# Patient Record
Sex: Female | Born: 2005 | Hispanic: Yes | Marital: Single | Smoking: Never smoker
Health system: Southern US, Community
[De-identification: ages and names within clinical notes are randomized; demographics above are authoritative.]

---
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
## 2023-03-21 ENCOUNTER — Other Ambulatory Visit: Payer: Self-pay

## 2023-03-21 ENCOUNTER — Ambulatory Visit: Payer: Commercial Managed Care - PPO | Admitting: Family Medicine

## 2023-03-21 VITALS — BP 119/51 | HR 67 | Wt 233.8 lb

## 2023-03-21 DIAGNOSIS — Z3491 Encounter for supervision of normal pregnancy, unspecified, first trimester: Secondary | ICD-10-CM | POA: Diagnosis not present

## 2023-03-21 DIAGNOSIS — Z3201 Encounter for pregnancy test, result positive: Secondary | ICD-10-CM | POA: Diagnosis not present

## 2023-03-21 LAB — POCT PREGNANCY, URINE: Preg Test, Ur: POSITIVE — AB

## 2023-03-21 NOTE — Progress Notes (Signed)
Possible Pregnancy  Here today for pregnancy confirmation. UPT in office today is positive. This is a planned pregnancy. Reviewed dating with patient:   LMP: 02/20/23 (within days)  EDD: 11/27/23 4w 1d today  Pt reports positive UPT at home 2 weeks ago, which may indicate unsure dating. Will schedule first trimester [place] to confirm dating; reviewed with Cresenzo, MD who recommends this at 8 weeks. OB history reviewed; single, full-term vaginal birth in 2022. Reviewed medications and allergies with patient; list of medications safe to take during pregnancy given. Cresenzo, MD to room for brief visit to establish care. Pt to schedule routine OB care during check out.  Marjo Bicker, RN 03/21/2023  8:29 AM  Chart reviewed for nurse visit. Agree with plan of care.  Introduced patient to the practice, discussed previous pregnancies.  Discussed initial prenatal intake visit with lab work and patient understanding.  No further questions or concerns.  Celedonio Savage, MD 03/21/2023 10:59 AM

## 2023-03-21 NOTE — Patient Instructions (Signed)

## 2023-03-27 ENCOUNTER — Emergency Department
Admission: EM | Admit: 2023-03-27 | Discharge: 2023-03-27 | Disposition: A | Discharge status: Home / Self Care | Payer: Commercial Managed Care - PPO | Attending: Emergency Medicine | Admitting: Emergency Medicine

## 2023-03-27 ENCOUNTER — Emergency Department: Payer: Commercial Managed Care - PPO

## 2023-03-27 ENCOUNTER — Other Ambulatory Visit: Payer: Self-pay

## 2023-03-27 DIAGNOSIS — Z3A01 Less than 8 weeks gestation of pregnancy: Secondary | ICD-10-CM | POA: Diagnosis not present

## 2023-03-27 DIAGNOSIS — O200 Threatened abortion: Secondary | ICD-10-CM | POA: Insufficient documentation

## 2023-03-27 DIAGNOSIS — O26851 Spotting complicating pregnancy, first trimester: Secondary | ICD-10-CM | POA: Diagnosis present

## 2023-03-27 LAB — CBC
HCT: 37.9 % (ref 36.0–46.0)
Hemoglobin: 12.4 g/dL (ref 12.0–15.0)
MCH: 27.1 pg (ref 26.0–34.0)
MCHC: 32.7 g/dL (ref 30.0–36.0)
MCV: 82.9 fL (ref 80.0–100.0)
Platelets: 408 10*3/uL — ABNORMAL HIGH (ref 150–400)
RBC: 4.57 MIL/uL (ref 3.87–5.11)
RDW: 13 % (ref 11.5–15.5)
WBC: 8.8 10*3/uL (ref 4.0–10.5)
nRBC: 0 % (ref 0.0–0.2)

## 2023-03-27 LAB — COMPREHENSIVE METABOLIC PANEL
ALT: 12 U/L (ref 0–44)
AST: 18 U/L (ref 15–41)
Albumin: 3.8 g/dL (ref 3.5–5.0)
Alkaline Phosphatase: 61 U/L (ref 38–126)
Anion gap: 11 (ref 5–15)
BUN: 13 mg/dL (ref 6–20)
CO2: 21 mmol/L — ABNORMAL LOW (ref 22–32)
Calcium: 8.7 mg/dL — ABNORMAL LOW (ref 8.9–10.3)
Chloride: 102 mmol/L (ref 98–111)
Creatinine, Ser: 0.55 mg/dL (ref 0.44–1.00)
GFR, Estimated: >60 mL/min (ref >60)
Glucose, Bld: 86 mg/dL (ref 70–99)
Potassium: 3.9 mmol/L (ref 3.5–5.1)
Sodium: 134 mmol/L — ABNORMAL LOW (ref 135–145)
Total Bilirubin: 0.4 mg/dL (ref <1.2)
Total Protein: 7.6 g/dL (ref 6.5–8.1)

## 2023-03-27 LAB — HCG, QUANTITATIVE, PREGNANCY: hCG, Beta Chain, Quant, S: 18 m[IU]/mL — ABNORMAL HIGH (ref <5)

## 2023-03-27 LAB — ABO/RH: ABO/RH(D): B POS

## 2023-03-27 LAB — POC URINE PREG, ED: Preg Test, Ur: NEGATIVE

## 2023-03-27 NOTE — ED Notes (Signed)
Patient discharged from ED by provider. Discharge instructions reviewed with patient and understanding verbalized. Patient ambulatory from ED in NAD.

## 2023-03-27 NOTE — Discharge Instructions (Signed)
As we discussed please take a pregnancy test in 1 week.  If this is negative then this is consistent with unfortunately likely miscarriage.  If positive in 1 week please follow-up with your OB/GYN or return to the emergency department for further workup.  Also return to the emergency department for any significant abdominal pain or any other symptom concerning to yourself.

## 2023-03-27 NOTE — ED Provider Triage Note (Signed)
Emergency Medicine Provider Triage Evaluation Note  Maria Reese , a 17 y.o. female  was evaluated in triage.  Pt complains of vaginal bleeding that started this morning.  Patient reports she is approximately [redacted] weeks pregnant.    Review of Systems  Positive: Abdominal cramping Negative: Fever, recent illness.  Physical Exam  BP 129/89   Pulse 68   Temp 98.2 F (36.8 C) (Oral)   Resp 16   Ht 5\' 7"  (1.702 m)   Wt 106.1 kg   LMP 02/20/2023 (Within Days)   SpO2 99%   BMI 36.64 kg/m  Gen:   Awake, no distress   Resp:  Normal effort  MSK:   Moves extremities without difficulty  Other:  Abdomen soft and mildly lower abdominal tenderness    Medical Decision Making  Medically screening exam initiated at 3:27 PM.  Appropriate orders placed.  Inger Dandrea was informed that the remainder of the evaluation will be completed by another provider, this initial triage assessment does not replace that evaluation, and the importance of remaining in the ED until their evaluation is complete.  Vaginal bleeding workup in early pregnancy ordered in triage.   Romeo Apple, Charels Stambaugh A, PA-C 03/27/23 [phone]

## 2023-03-27 NOTE — ED Provider Notes (Signed)
[facility] Provider Note    Event Date/Time   First MD Initiated Contact with Patient 03/27/23 1717     (approximate)  History   Chief Complaint: Vaginal Bleeding  HPI  Maria Reese is a 17 y.o. female G2, P1 approximately 5 to [redacted] weeks pregnant who presents to the emergency department for vaginal bleeding.  According to the patient she took a positive pregnancy test at home 03/11/2023.  Believes her last menstrual period was the first week of November.  Patient states since this morning she has been experiencing vaginal bleeding similar to a menstrual cycle as well as some lower abdominal cramping.  Physical Exam   Triage Vital Signs: ED Triage Vitals  Encounter Vitals Group     BP 03/27/23 1520 129/89     Systolic BP Percentile --      Diastolic BP Percentile --      Pulse Rate 03/27/23 1519 68     Resp 03/27/23 1519 16     Temp 03/27/23 1519 98.2 F (36.8 C)     Temp Source 03/27/23 1519 Oral     SpO2 03/27/23 1519 99 %     Weight 03/27/23 1519 233 lb 14.5 oz (106.1 kg)     Height 03/27/23 1519 5\' 7"  (1.702 m)     Head Circumference --      Peak Flow --      Pain Score 03/27/23 1519 3     Pain Loc --      Pain Education --      Exclude from Growth Chart --     Most recent vital signs: Vitals:   03/27/23 1519 03/27/23 1520  BP:  129/89  Pulse: 68   Resp: 16   Temp: 98.2 F (36.8 C)   SpO2: 99%     General: Awake, no distress.  CV:  Good peripheral perfusion.  Regular rate and rhythm  Resp:  Normal effort.  Equal breath sounds bilaterally.  Abd:  No distention.  Soft, nontender.  No rebound or guarding.  Benign abdomen.    ED Results / Procedures / Treatments    RADIOLOGY  Ultrasound shows no IUP identified.   MEDICATIONS ORDERED IN ED: Medications - No data to display   IMPRESSION / MDM / ASSESSMENT AND PLAN / ED COURSE  I reviewed the triage vital signs and the nursing notes.  Patient's presentation is most  consistent with acute presentation with potential threat to life or bodily function.  Patient presents the emergency department for vaginal bleeding similar to a menstrual cycle approximately 5 to [redacted] weeks pregnant.  Patient states her first positive pregnancy test was 03/11/2023, unfortunately the patient's beta-hCG today is only 18 concerning for likely miscarriage.  Patient CBC is reassuring chemistry reassuring.  B+ blood type does not require RhoGAM.  Will obtain an ultrasound and have the patient follow-up with her doctor as long as there is no concerning findings.  Patient agreeable to plan of care.  No IUP on ultrasound.  Discussed with the patient the likelihood of a miscarriage.  Patient will check a home pregnancy test in 1 week if negative she will follow-up with her PCP, if positive patient will follow-up with OB or return to the emergency department.  Also discussed return precautions for any abdominal pain or significant bleeding.  FINAL CLINICAL IMPRESSION(S) / ED DIAGNOSES   Miscarriage in progress   Note:  This document was prepared using Dragon voice recognition software and may include  unintentional dictation errors.   Minna Antis, MD 03/27/23 Rickey Primus

## 2023-03-27 NOTE — ED Triage Notes (Signed)
Pt here with vaginal bleeding that started today. Pt states she saw a small amount of blood with no clots. Pt is [redacted] weeks pregnant. Pt denies NVD.

## 2023-04-17 ENCOUNTER — Other Ambulatory Visit: Payer: Commercial Managed Care - PPO

## 2023-05-02 ENCOUNTER — Telehealth: Payer: Commercial Managed Care - PPO

## 2023-05-09 ENCOUNTER — Encounter: Payer: Self-pay | Admitting: Obstetrics and Gynecology

## 2023-06-21 IMAGING — US US OB FOLLOW-UP
1 series · 13 of 28 positions shown · non-contrast
Comparison: none

CLINICAL DATA: Evaluate fetal growth.

EXAM:
OBSTETRIC 14+ WK ULTRASOUND FOLLOW-UP

[Series 1: us ob follow-up · 0.23mm/px · 13 of 32 slices shown]
[im 2/32]
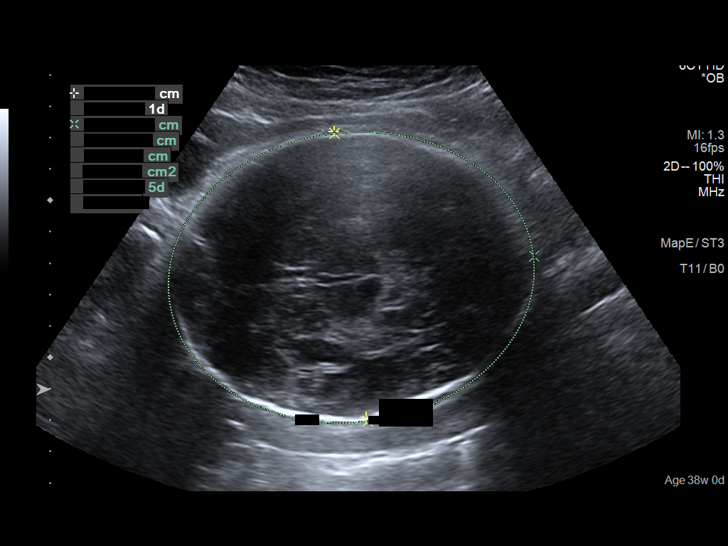
[im 4/32]
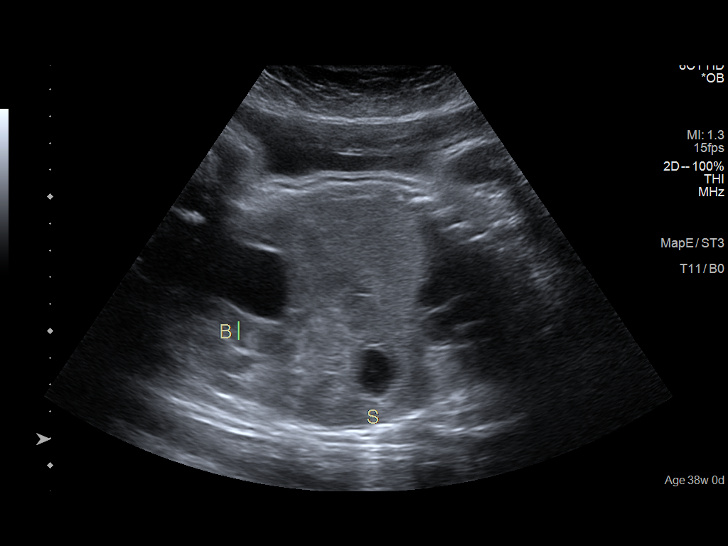
[im 6/32]
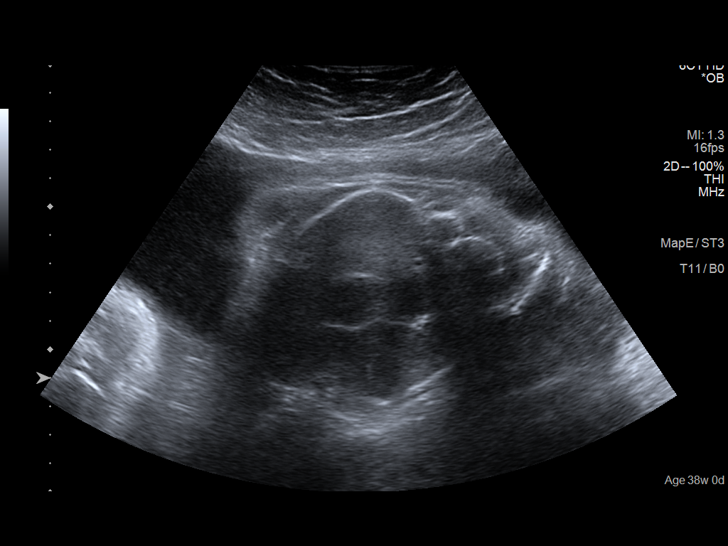
[im 9/32]
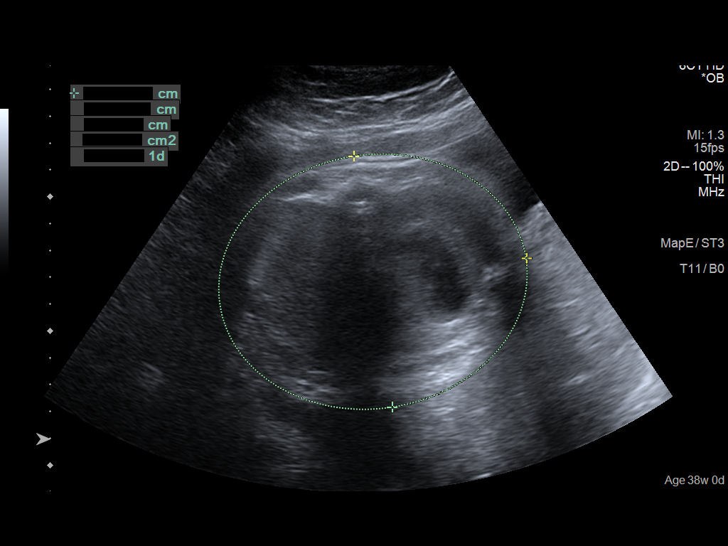
[im 11/32]
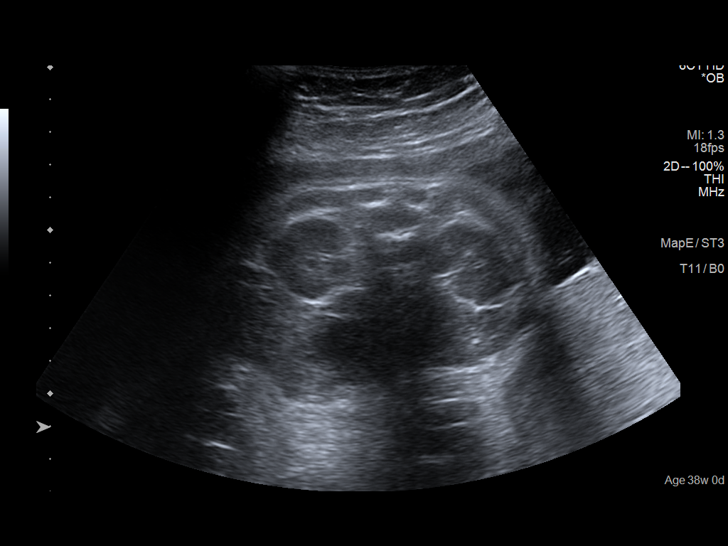
[im 13/32]
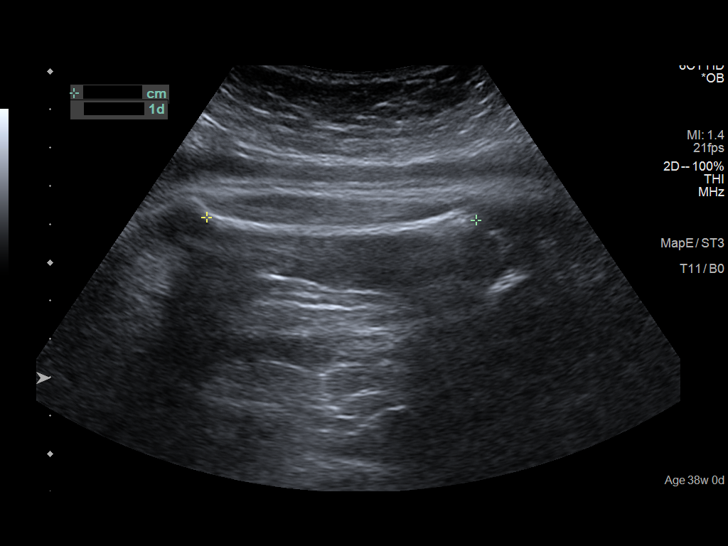
[im 17/32]
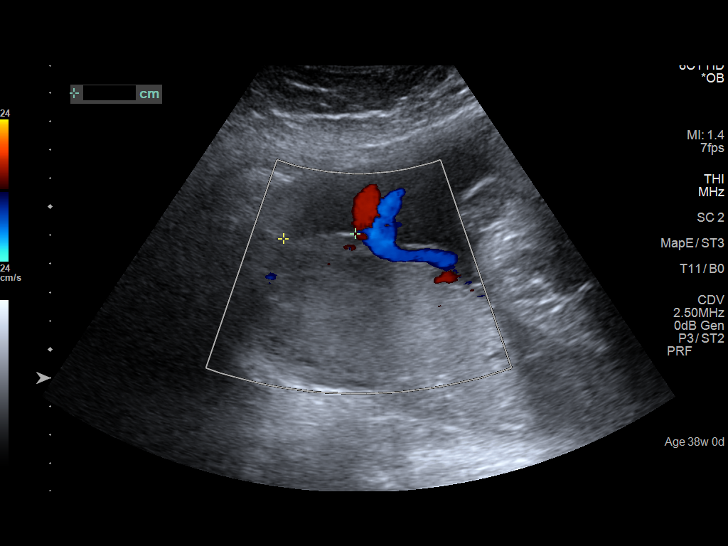
[im 19/32]
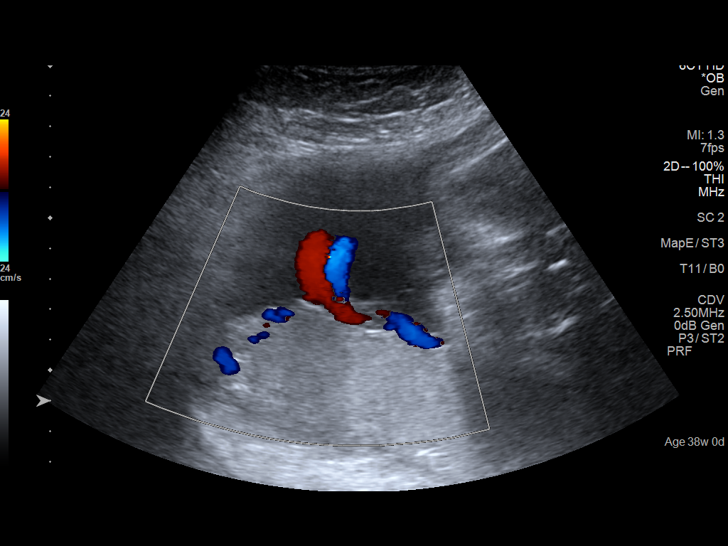
[im 21/32]
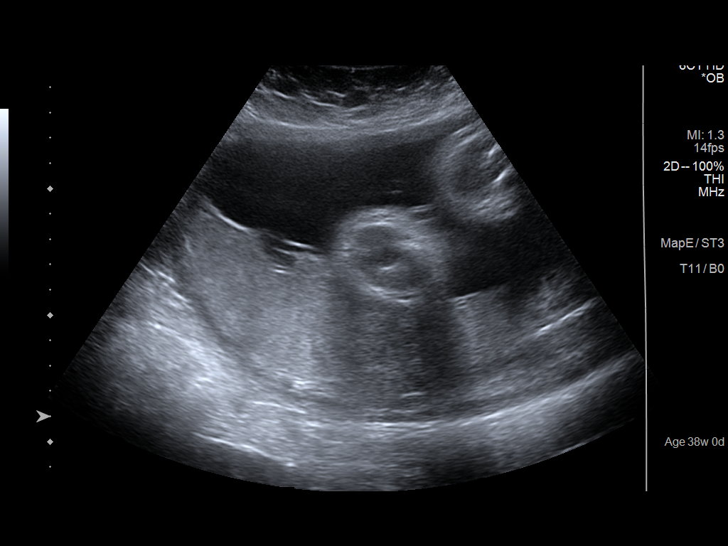
[im 23/32]
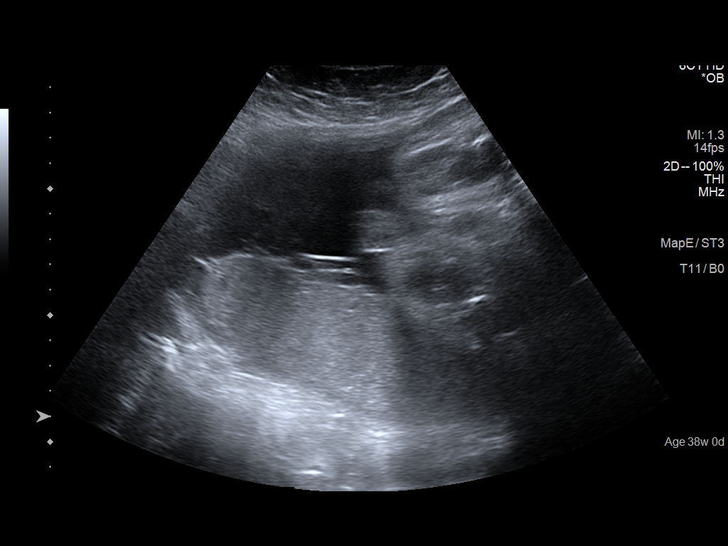
[im 26/32]
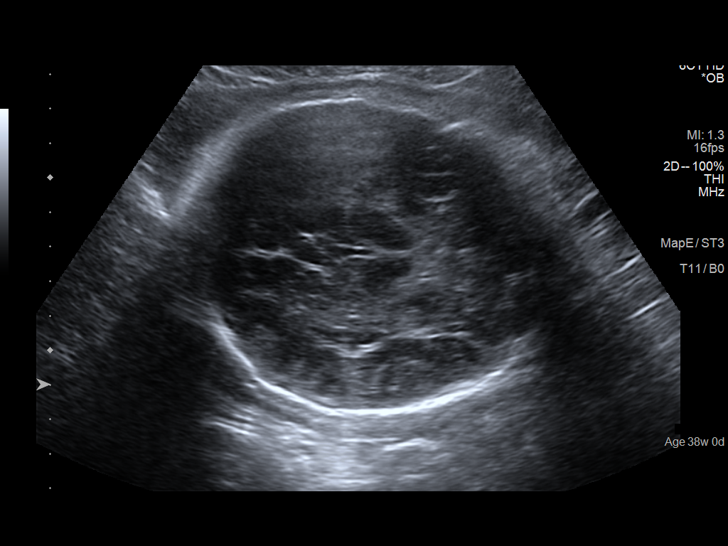
[im 28/32]
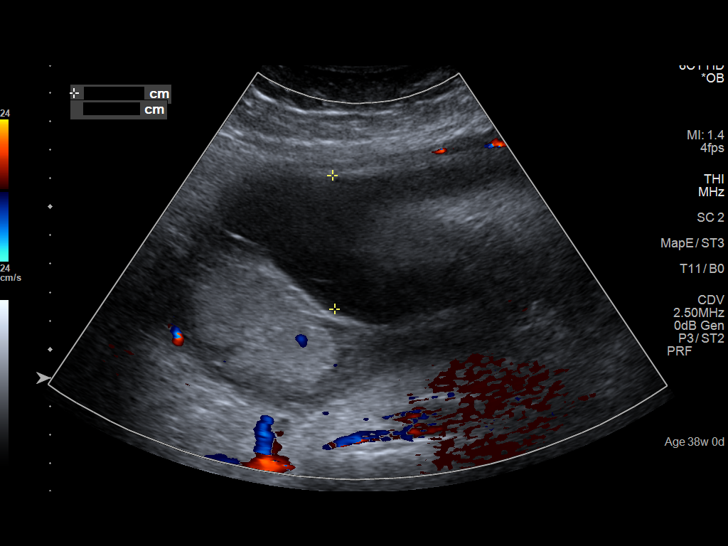
[im 30/32]
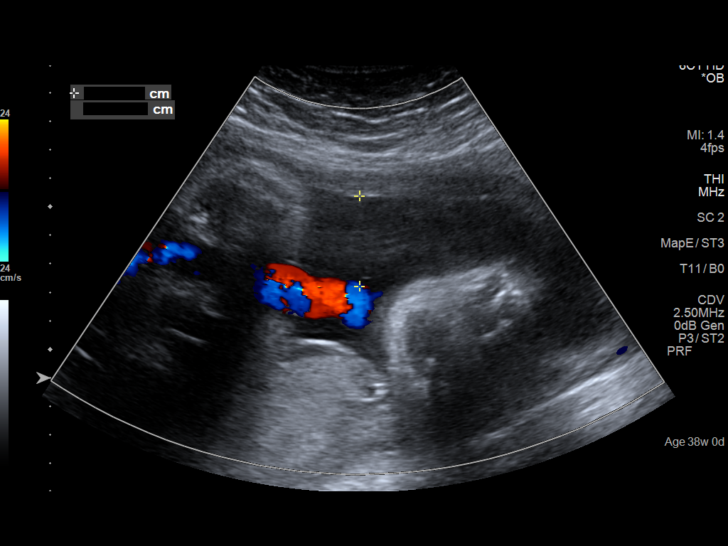

[13 of 28 positions shown; findings below may reference images not displayed]

FINDINGS: Number of Fetuses: 1

Heart Rate:  147 bpm

Movement: Yes

Presentation: Cephalic

Previa: No

Placental Location: Posterior

Amniotic Fluid (Subjective): Normal

Amniotic Fluid (Objective):

Vertical pocket 4.7cm

AFI 12.6 cm (5%ile= 7.3 cm, 95%= 23.9 cm for 38 wks)

FETAL BIOMETRY

BPD: 9.2cm 37w 1d

HC: 33.2cm  37w   6d

AC: 33.2cm  37w   1d

FL: 7.1cm  36w   1d

Current Mean GA: 37w 1d              US EDC: 11/05/2020

Assigned GA: 36w 5d     Assigned EDC: 11/08/2020

Estimated Fetal Weight:  3078g 35%ile

FETAL ANATOMY

Lateral Ventricles: Appears normal

Thalami/CSP: Appears normal

Posterior Fossa: Previously seen

Nuchal Region: Previously seen

Upper Lip: Previously seen

Spine: Previously seen

4 Chamber Heart on Left: Appears normal

LVOT: Not visualized

RVOT: Not visualized

Stomach on Left: Appears normal

3 Vessel Cord: Previously seen

Cord Insertion site: Previously seen

Kidneys: Appears normal

Bladder: Appears normal

Extremities: Previously seen

Sex: Male

Technical Limitations: Advanced age

Maternal Findings:

None
IMPRESSION: 1. Single live intrauterine pregnancy as detailed above.
2. AFI 12.6 cm
3. Estimated fetal weight 9178 g at 35 percentile.

## 2023-07-18 IMAGING — US US OB LIMITED
1 series · 14 of 14 positions shown · non-contrast
Comparison: 10/16/2020

CLINICAL DATA: Post dates. Assess amniotic fluid and biophysical
profile

EXAM:
LIMITED OBSTETRIC ULTRASOUND AND BIOPHYSICAL PROFILE

[Series 1: us fetal bpp wo non stress · 14 acquisitions, 14 frames shown]
[im 1/14]
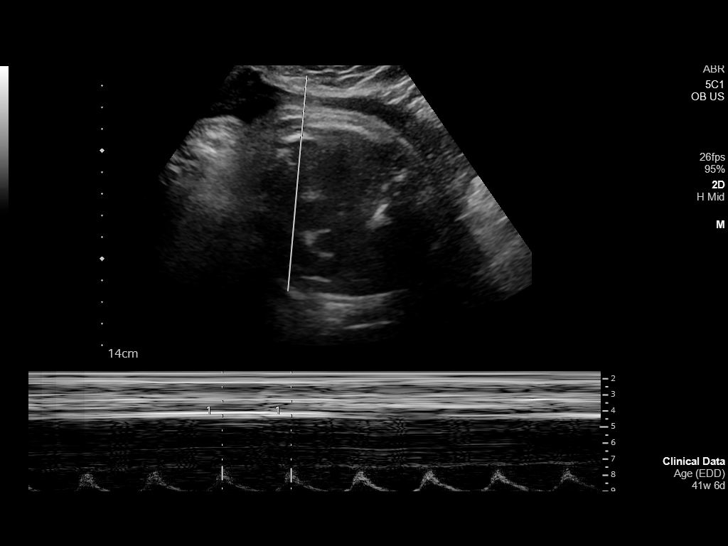
[im 2/14]
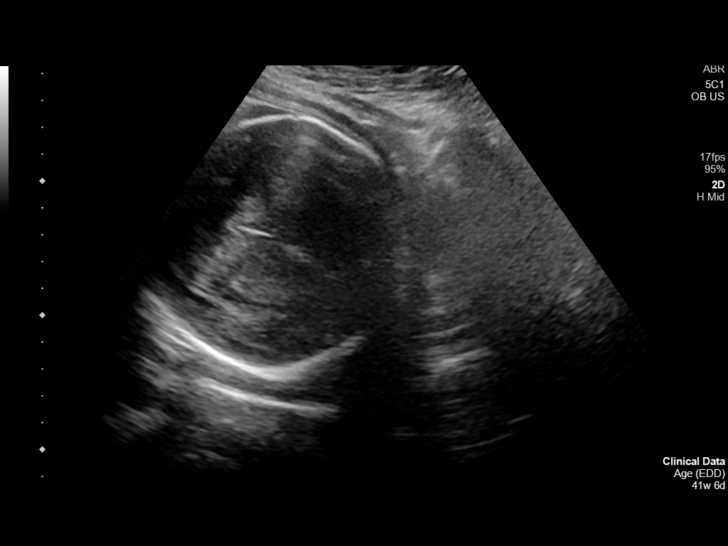
[im 3/14]
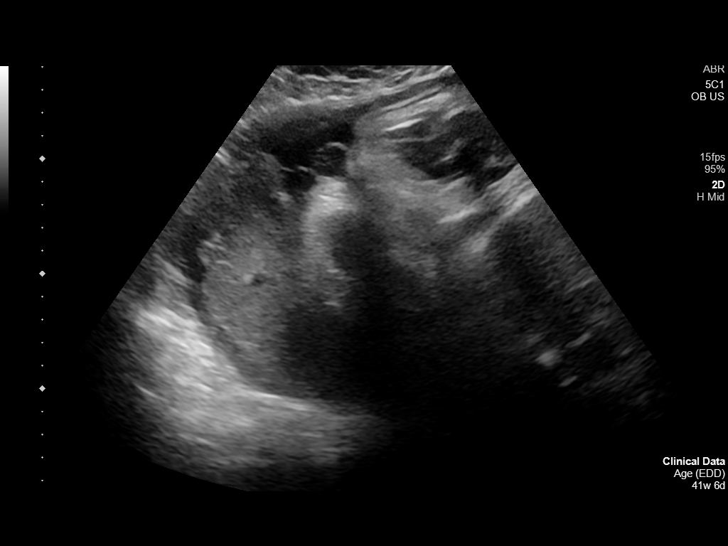
[im 4/14]
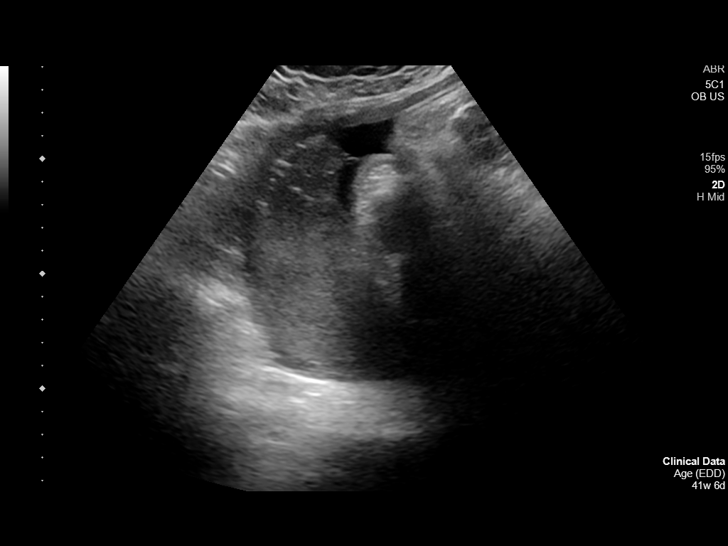
[im 5/14]
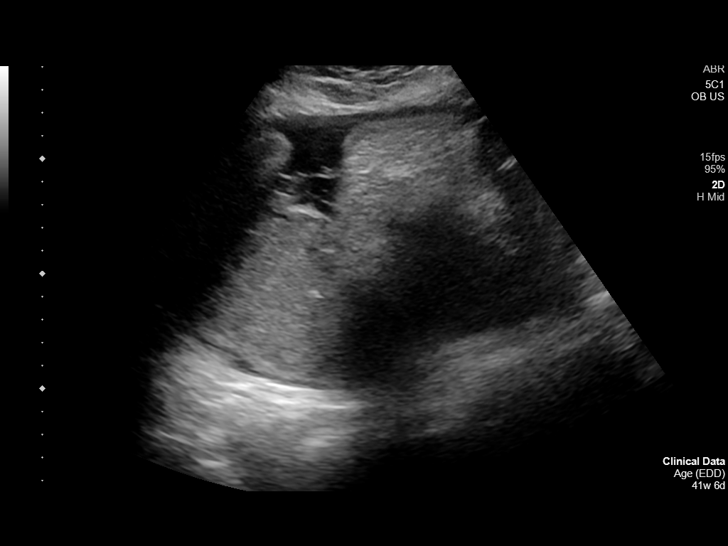
[im 6/14]
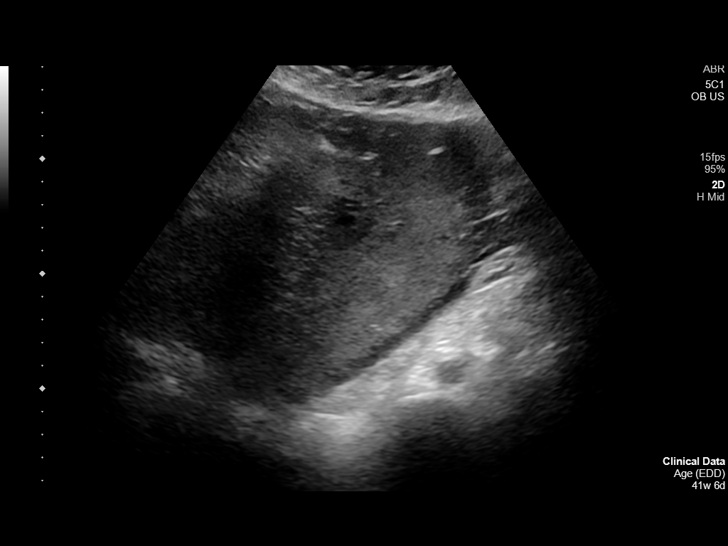
[im 7/14]
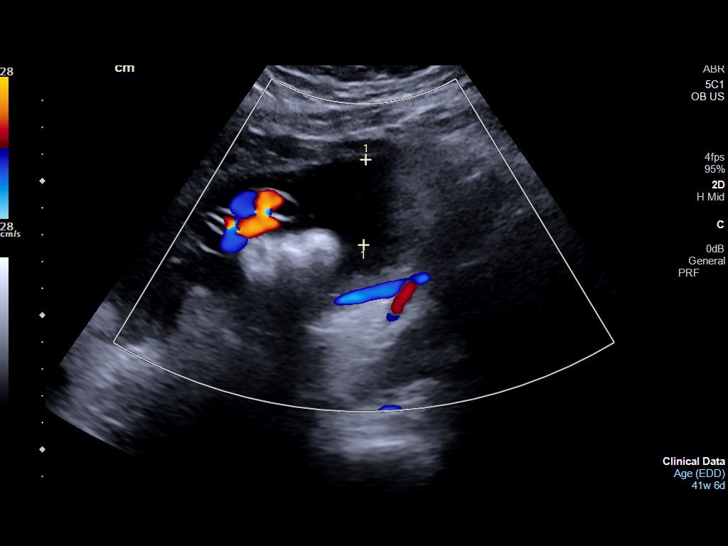
[im 8/14]
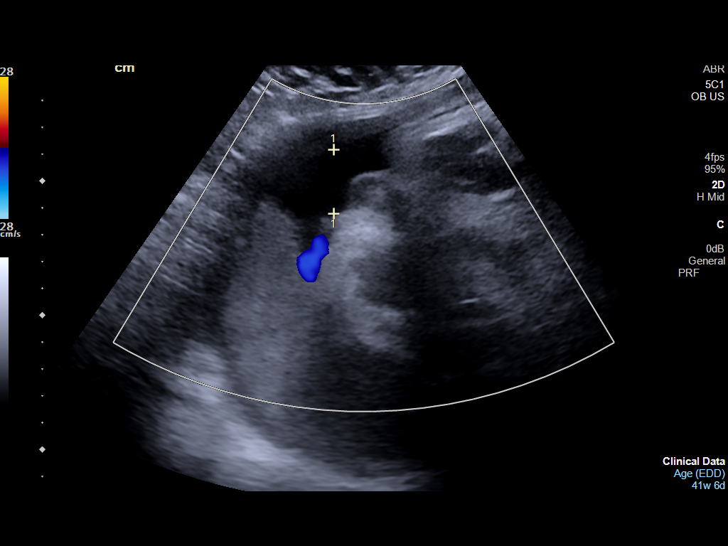
[im 9/14]
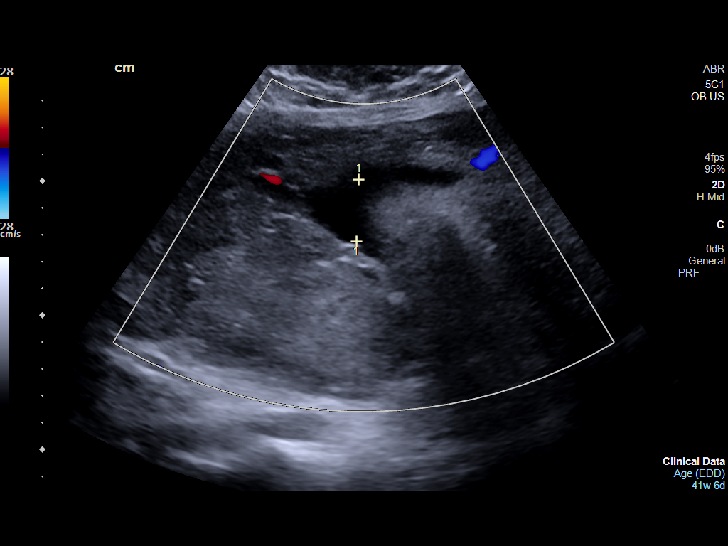
[im 10/14]
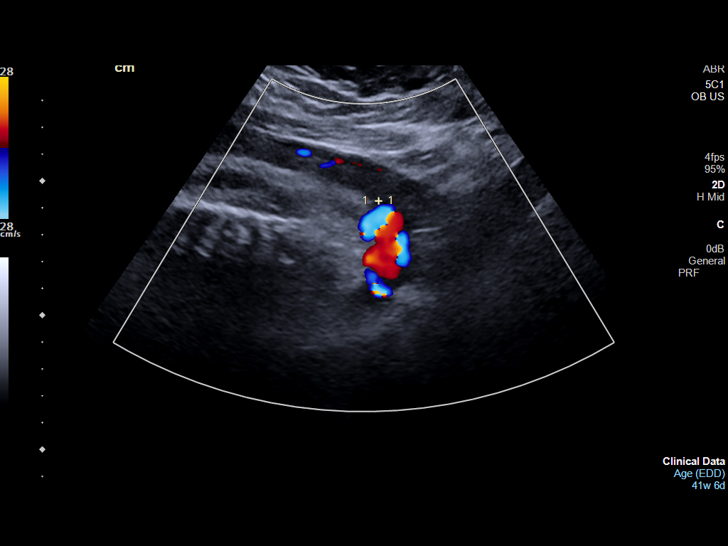
[im 11/14]
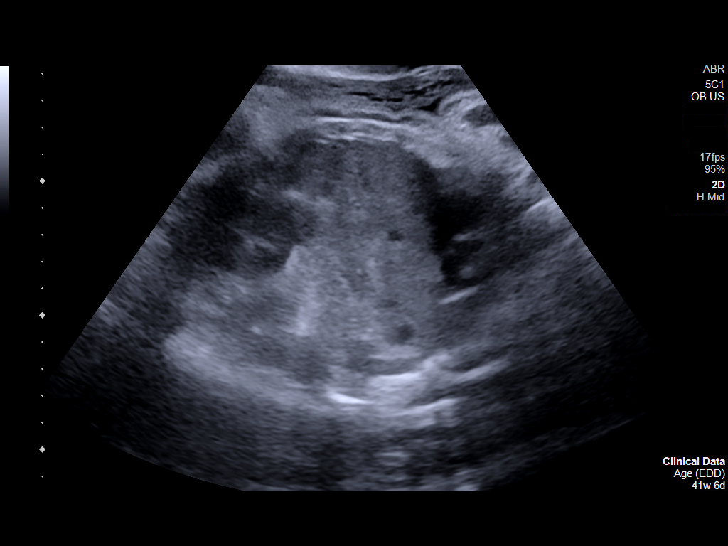
[im 12/14]
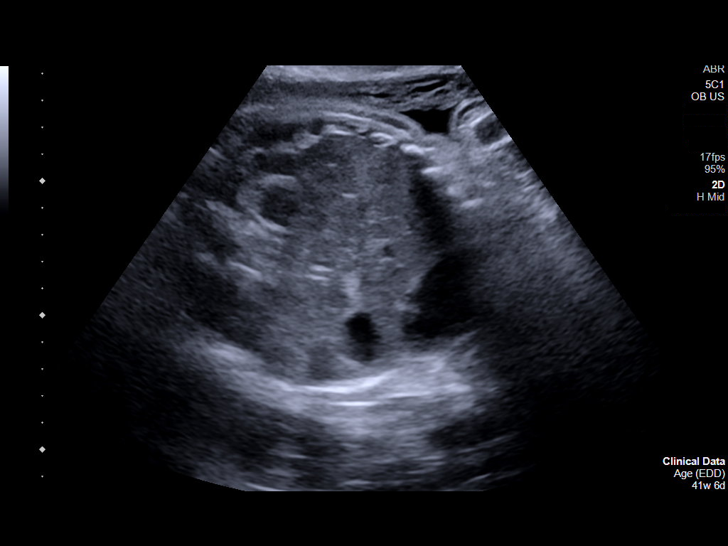
[im 13/14]
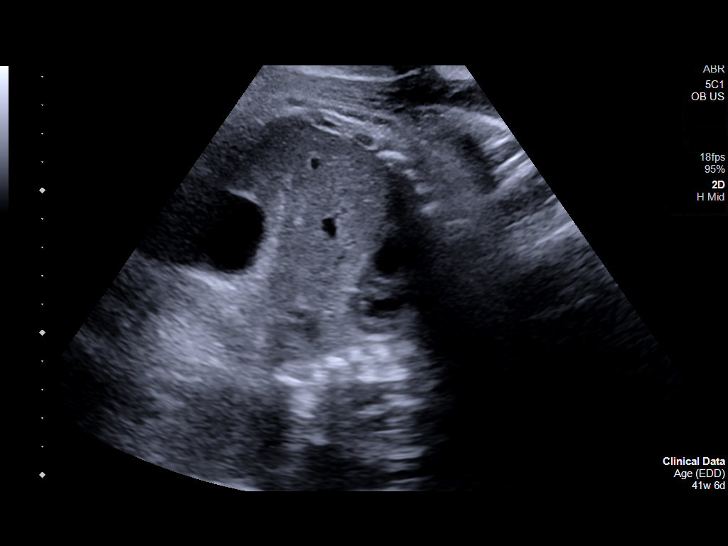
[im 14/14]
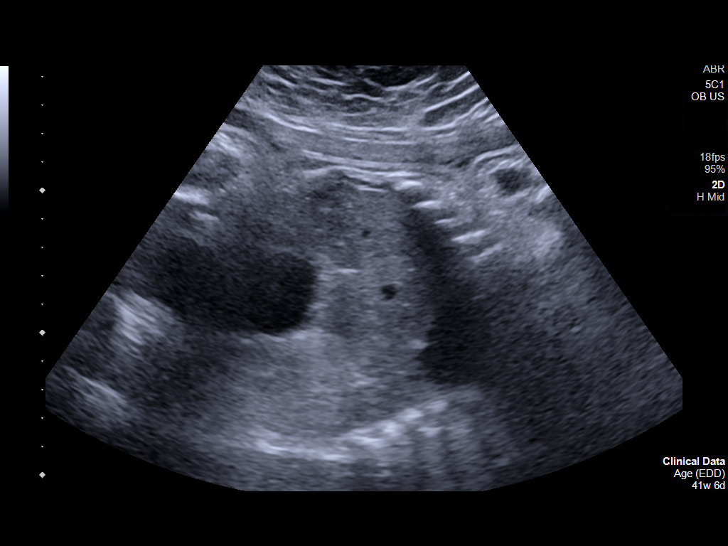

[14 of 14 positions shown; findings below may reference images not displayed]

FINDINGS: Number of Fetuses: 1

Heart Rate:  148 bpm

Movement: Yes

Presentation: Cephalic

Placental Location: Fundal

Previa: No

Amniotic Fluid (Subjective):  Within normal limits.

AFI: 7.9 cm

Assigned GA: 40w 4d

MATERNAL FINDINGS:

Cervix:  Obscured.

Uterus/Adnexae: No abnormality visualized.

Movement:  2  Time: 16 minutes

Breathing: 2

Tone:  2

Amniotic Fluid: 2

Total Score:  8
IMPRESSION: 1. Single live intrauterine gestation in cephalic presentation.
2. Assigned gestational age of 40 weeks 4 days.
3. Amniotic fluid index of 7.9 cm.
4. Biophysical profile score is [DATE].

This exam is performed on a limited basis and does not
comprehensively evaluate fetal size, dating, or anatomy; follow-up
complete OB US should be considered if further fetal assessment is
warranted.

## 2023-07-18 IMAGING — US US FETAL BPP W/O NONSTRESS
1 series · 14 of 14 positions shown · non-contrast
Comparison: 10/16/2020

CLINICAL DATA: Post dates. Assess amniotic fluid and biophysical
profile

EXAM:
LIMITED OBSTETRIC ULTRASOUND AND BIOPHYSICAL PROFILE

[Series 1: us fetal bpp wo non stress · 14 acquisitions, 14 frames shown]
[im 1/14]
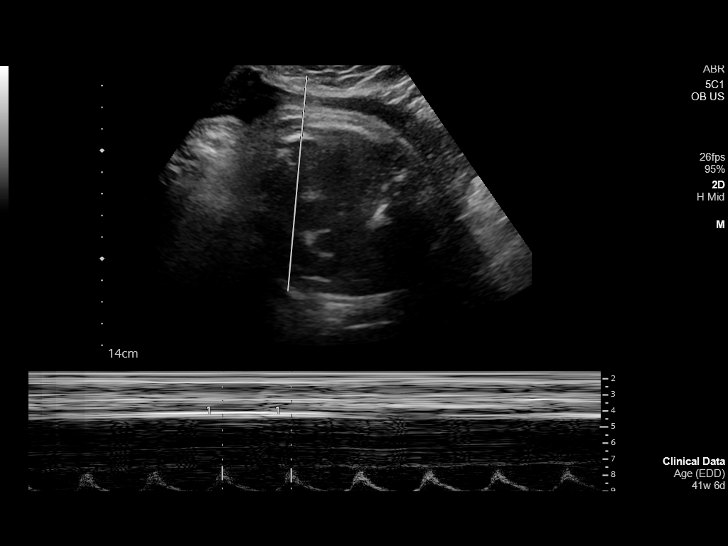
[im 2/14]
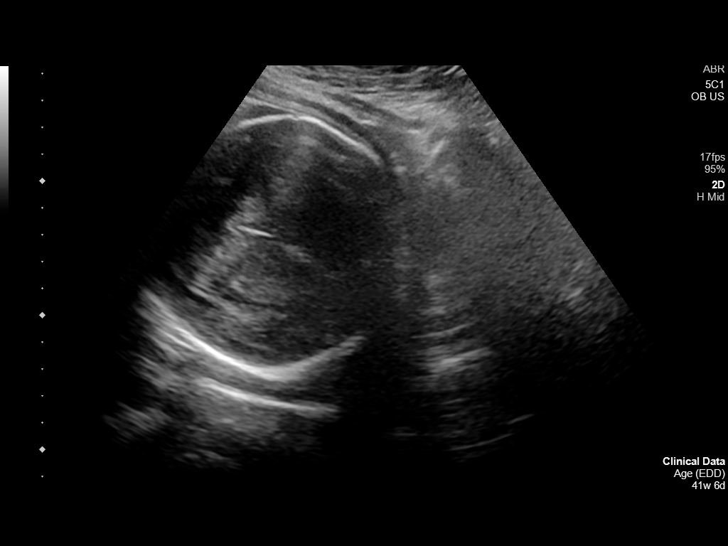
[im 3/14]
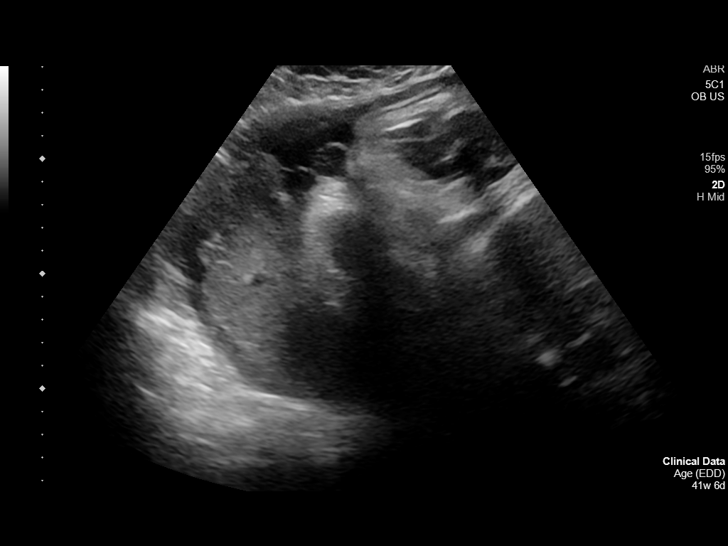
[im 4/14]
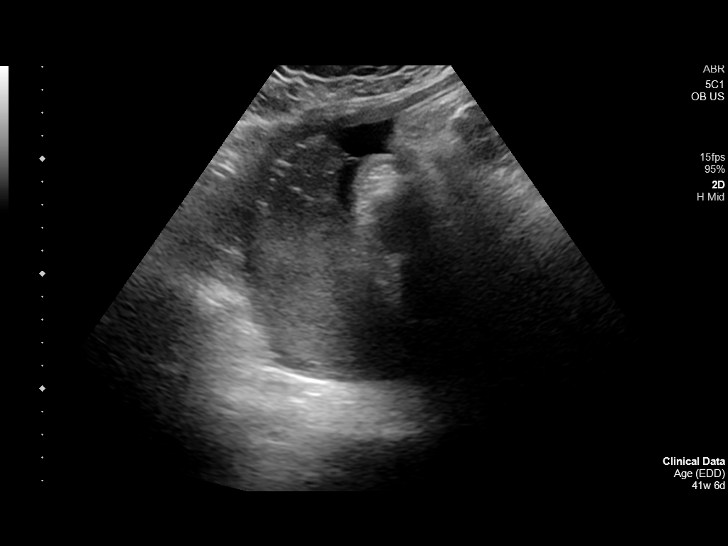
[im 5/14]
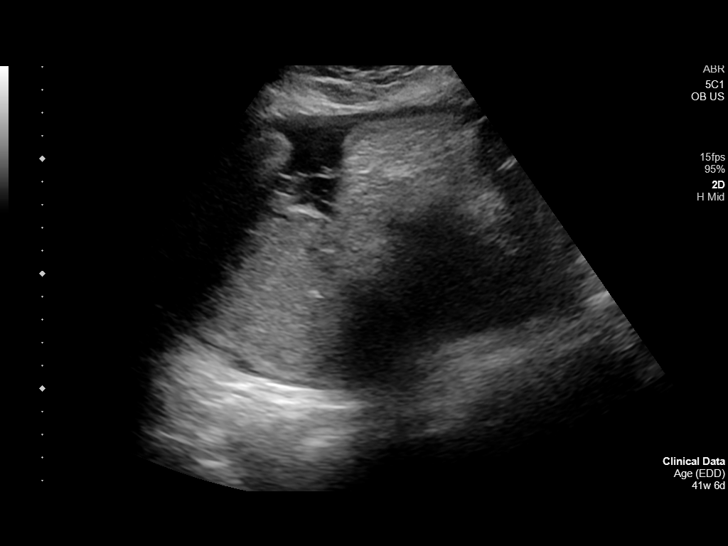
[im 6/14]
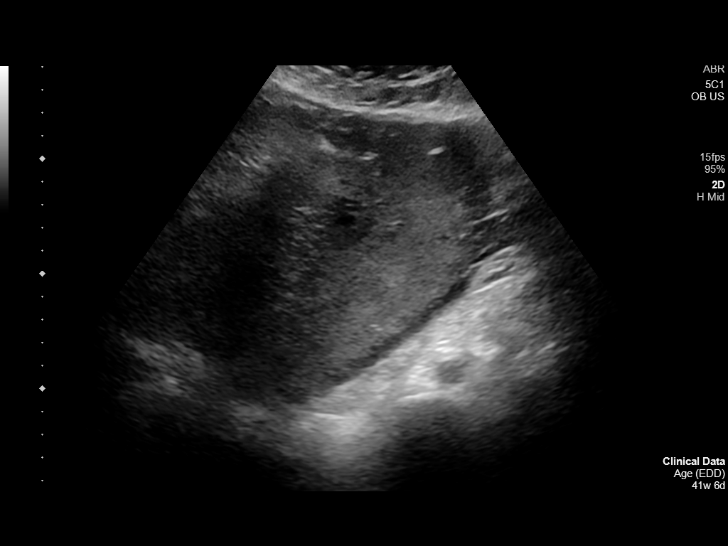
[im 7/14]
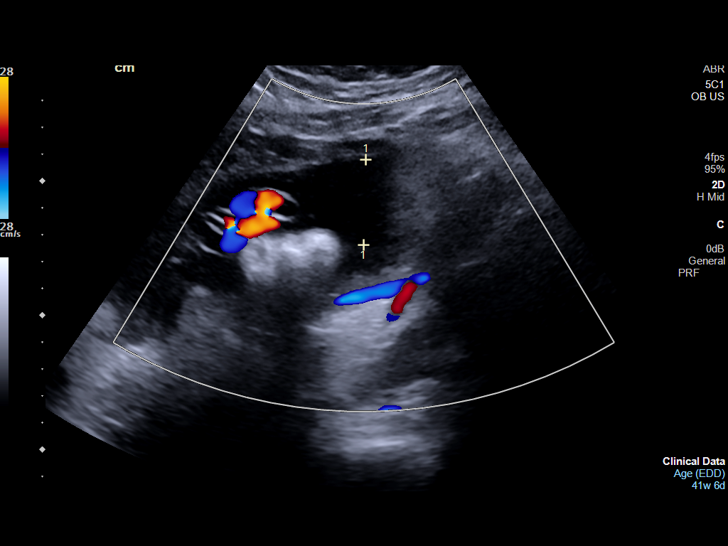
[im 8/14]
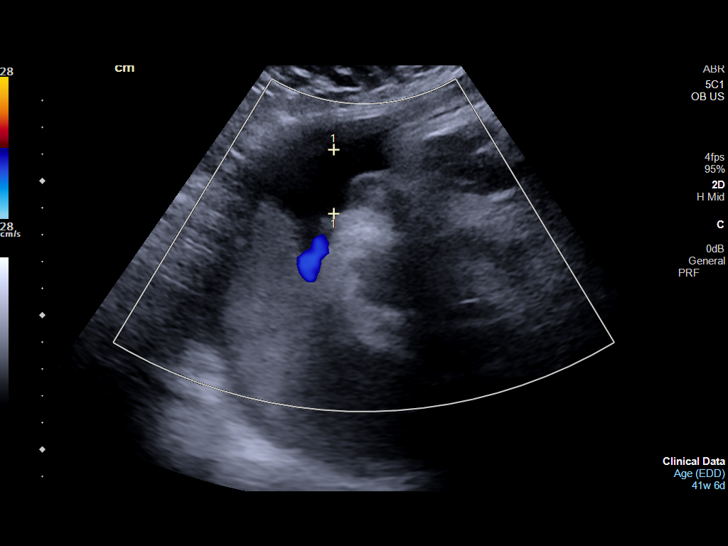
[im 9/14]
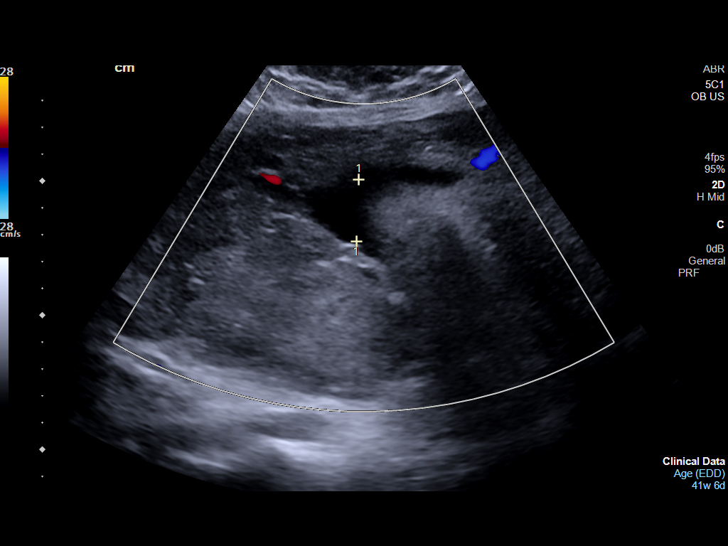
[im 10/14]
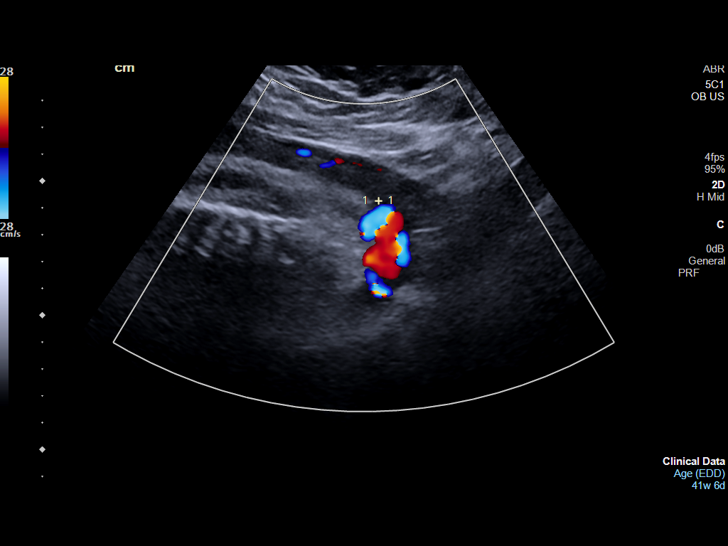
[im 11/14]
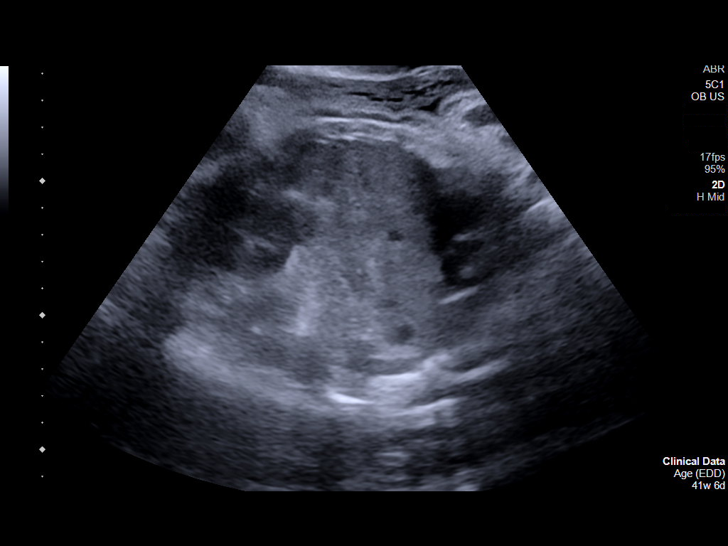
[im 12/14]
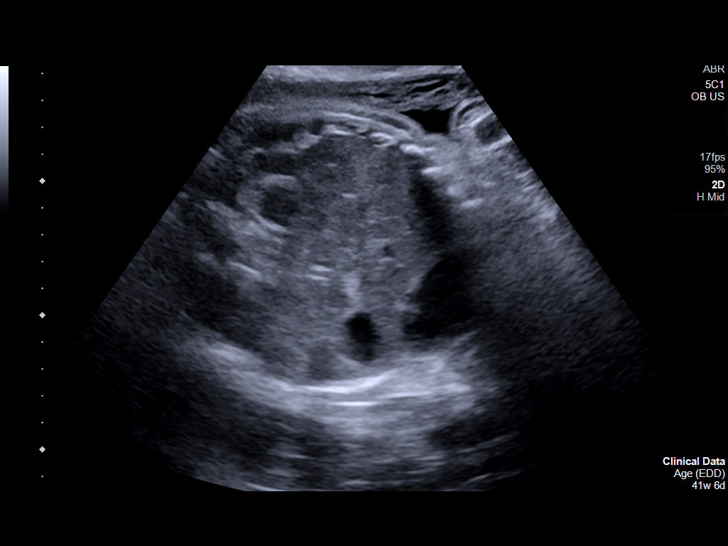
[im 13/14]
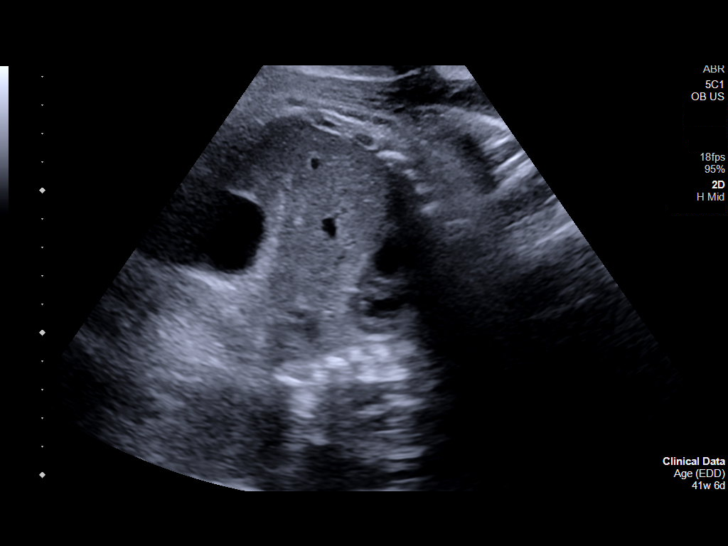
[im 14/14]
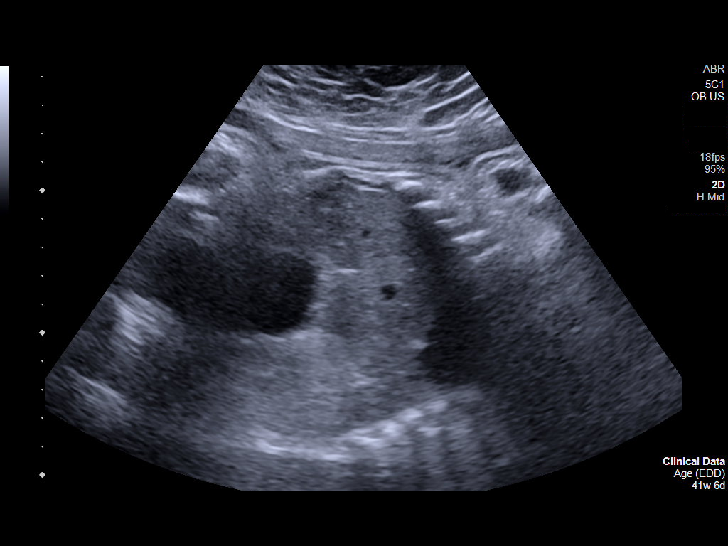

[14 of 14 positions shown; findings below may reference images not displayed]

FINDINGS: Number of Fetuses: 1

Heart Rate:  148 bpm

Movement: Yes

Presentation: Cephalic

Placental Location: Fundal

Previa: No

Amniotic Fluid (Subjective):  Within normal limits.

AFI: 7.9 cm

Assigned GA: 40w 4d

MATERNAL FINDINGS:

Cervix:  Obscured.

Uterus/Adnexae: No abnormality visualized.

Movement:  2  Time: 16 minutes

Breathing: 2

Tone:  2

Amniotic Fluid: 2

Total Score:  8
IMPRESSION: 1. Single live intrauterine gestation in cephalic presentation.
2. Assigned gestational age of 40 weeks 4 days.
3. Amniotic fluid index of 7.9 cm.
4. Biophysical profile score is [DATE].

This exam is performed on a limited basis and does not
comprehensively evaluate fetal size, dating, or anatomy; follow-up
complete OB US should be considered if further fetal assessment is
warranted.
